# Patient Record
Sex: Male | Born: 1955 | Race: White | Hispanic: No | Smoking: Current every day smoker
Health system: Southern US, Community
[De-identification: ages and names within clinical notes are randomized; demographics above are authoritative.]

---

## 2004-01-26 ENCOUNTER — Other Ambulatory Visit: Payer: Self-pay

## 2017-07-18 ENCOUNTER — Emergency Department
Admission: EM | Admit: 2017-07-18 | Discharge: 2017-07-18 | Disposition: A | Discharge status: Home / Self Care | Payer: Self-pay | Attending: Emergency Medicine | Admitting: Emergency Medicine

## 2017-07-18 ENCOUNTER — Encounter: Payer: Self-pay | Admitting: Emergency Medicine

## 2017-07-18 ENCOUNTER — Emergency Department: Payer: Self-pay

## 2017-07-18 DIAGNOSIS — R42 Dizziness and giddiness: Secondary | ICD-10-CM | POA: Insufficient documentation

## 2017-07-18 DIAGNOSIS — F17200 Nicotine dependence, unspecified, uncomplicated: Secondary | ICD-10-CM | POA: Insufficient documentation

## 2017-07-18 DIAGNOSIS — H532 Diplopia: Secondary | ICD-10-CM | POA: Insufficient documentation

## 2017-07-18 LAB — BASIC METABOLIC PANEL
ANION GAP: 9 (ref 5–15)
BUN: 23 mg/dL — AB (ref 6–20)
CO2: 23 mmol/L (ref 22–32)
CREATININE: 1.01 mg/dL (ref 0.61–1.24)
Calcium: 9.7 mg/dL (ref 8.9–10.3)
Chloride: 108 mmol/L (ref 101–111)
Glucose, Bld: 95 mg/dL (ref 65–99)
Potassium: 4 mmol/L (ref 3.5–5.1)
Sodium: 140 mmol/L (ref 135–145)

## 2017-07-18 LAB — URINALYSIS, COMPLETE (UACMP) WITH MICROSCOPIC
BILIRUBIN URINE: NEGATIVE
Bacteria, UA: NONE SEEN
Glucose, UA: NEGATIVE mg/dL
Hgb urine dipstick: NEGATIVE
Ketones, ur: NEGATIVE mg/dL
LEUKOCYTES UA: NEGATIVE
NITRITE: NEGATIVE
PH: 5 (ref 5.0–8.0)
Protein, ur: NEGATIVE mg/dL
SPECIFIC GRAVITY, URINE: 1.021 (ref 1.005–1.030)
SQUAMOUS EPITHELIAL / LPF: NONE SEEN
WBC, UA: NONE SEEN WBC/hpf (ref 0–5)

## 2017-07-18 LAB — CBC
HCT: 42.7 % (ref 40.0–52.0)
Hemoglobin: 14.8 g/dL (ref 13.0–18.0)
MCH: 31 pg (ref 26.0–34.0)
MCHC: 34.6 g/dL (ref 32.0–36.0)
MCV: 89.6 fL (ref 80.0–100.0)
PLATELETS: 251 10*3/uL (ref 150–440)
RBC: 4.77 MIL/uL (ref 4.40–5.90)
RDW: 13.9 % (ref 11.5–14.5)
WBC: 8.3 10*3/uL (ref 3.8–10.6)

## 2017-07-18 LAB — GLUCOSE, CAPILLARY: Glucose-Capillary: 95 mg/dL (ref 65–99)

## 2017-07-18 LAB — TROPONIN I

## 2017-07-18 MED ORDER — ASPIRIN 81 MG PO CHEW
324.0000 mg | CHEWABLE_TABLET | Freq: Once | ORAL | Status: AC
  Administered 2017-07-18: 324 mg via ORAL
  Filled 2017-07-18: qty 4

## 2017-07-18 MED ORDER — ASPIRIN EC 81 MG PO TBEC: 81.0000 mg | DELAYED_RELEASE_TABLET | Freq: Every day | ORAL | 0 refills | Status: AC

## 2017-07-18 NOTE — ED Triage Notes (Signed)
Pt c/o blurred vision and dizziness on and off for two weeks. Denies any other symptoms at this time.

## 2017-07-18 NOTE — ED Provider Notes (Signed)
[facility] Emergency Department Provider Note  ____________________________________________   First MD Initiated Contact with Patient 07/18/17 1527     (approximate)  I have reviewed the triage vital signs and the nursing notes.   HISTORY  Chief Complaint Dizziness and Blurred Vision    HPI Miguel Thompson is a 61 y.o. male without any chronic medical conditions was presenting to the emergency department with several weeks of intermittent blurred/double vision and lightheadedness. He says that the episodes may last about 20 seconds at a time. He denies any weakness or any other symptoms during the episodes. Denies any ringing or hearing loss. Denies any pain. Says that he has been eating and drinking normally but does not drink a lot of water. 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 Says that his grandmother had a history of stroke. Has been smoking for approximately 40 years. He is asymptomatic at this time. Says that the episodes have not been getting any worse or more frequent and he sometimes goes days without having a single episode. He says that he came today because he had an episode while in the car which concerned him. However, he remains asymptomatic at this time.He describes his blurred vision is double vision that appears more the vertical plane.   History reviewed. No pertinent past medical history.  There are no active problems to display for this patient.   History reviewed. No pertinent surgical history.  Prior to Admission medications   Not on File    Allergies Codeine and Penicillins  No family history on file.  Social History Social History  Substance Use Topics  . Smoking status: Current Every Day Smoker  . Smokeless tobacco: Never Used  . Alcohol use No    Review of Systems  Constitutional: No fever/chills Eyes: as above ENT: No sore throat. Cardiovascular: Denies chest pain. Respiratory:  Denies shortness of breath. Gastrointestinal: No abdominal pain.  No nausea, no vomiting.  No diarrhea.  No constipation. Genitourinary: Negative for dysuria. Musculoskeletal: Negative for back pain. Skin: Negative for rash. Neurological: Negative for headaches, focal weakness or numbness.   ____________________________________________   PHYSICAL EXAM:  VITAL SIGNS: ED Triage Vitals [07/18/17 1349]  Enc Vitals Group     BP (!) 152/79     Pulse Rate 73     Resp 20     Temp 98.1 F (36.7 C)     Temp Source Oral     SpO2 96 %     Weight 205 lb (93 kg)     Height 5\' 10"  (1.778 m)     Head Circumference      Peak Flow      Pain Score      Pain Loc      Pain Edu?      Excl. in GC?     Constitutional: Alert and oriented. Well appearing and in no acute distress. Eyes: Conjunctivae are normal. PERRLA. No nystagmus. Extraocular muscles are intact. Head: Atraumatic. Mild to moderate cerumen buildup bilaterally without impaction. Nose: No congestion/rhinnorhea. Mouth/Throat: Mucous membranes are moist.  Neck: No stridor.   Cardiovascular: Normal rate, regular rhythm. Grossly normal heart sounds.   Respiratory: Normal respiratory effort.  No retractions. Lungs CTAB. Gastrointestinal: Soft and nontender. No distention. No CVA tenderness. Musculoskeletal: No lower extremity tenderness nor edema.  No joint effusions. Neurologic:  Normal speech and language. No gross focal neurologic deficits are appreciated. No ataxia on finger to  nose testing. Skin:  Skin is warm, dry and intact. No rash noted. Psychiatric: Mood and affect are normal. Speech and behavior are normal.  ____________________________________________   LABS (all labs ordered are listed, but only abnormal results are displayed)  Labs Reviewed  BASIC METABOLIC PANEL - Abnormal; Notable for the following:       Result Value   BUN 23 (*)    All other components within normal limits  URINALYSIS, COMPLETE (UACMP) WITH  MICROSCOPIC - Abnormal; Notable for the following:    Color, Urine YELLOW (*)    APPearance CLEAR (*)    All other components within normal limits  CBC  GLUCOSE, CAPILLARY  TROPONIN I  CBG MONITORING, ED   ____________________________________________  EKG  ED ECG REPORT I, Arelia LongestSchaevitz,  Rebekkah Powless M, the attending physician, personally viewed and interpreted this ECG.   Date: 07/18/2017  EKG Time: 1354  Rate: 82  Rhythm: normal sinus rhythm  Axis: Normal  Intervals:none  ST&T Change: No ST segment elevation or depression. No abnormal T-wave inversion.  ____________________________________________  RADIOLOGY  Normal CT of the head. ____________________________________________   PROCEDURES  Procedure(s) performed:   Procedures  Critical Care performed:   ____________________________________________   INITIAL IMPRESSION / ASSESSMENT AND PLAN / ED COURSE  Pertinent labs & imaging results that were available during my care of the patient were reviewed by me and considered in my medical decision making (see chart for details).  ----------------------------------------- 6:04 PM on 07/18/2017 -----------------------------------------  Patient without any further episodes of the blurred vision or dizziness in the emergency department. We discussed possible further imaging with MRI of the brain with the patient says that he would rather do this as an outpatient. Unfortunate, he does not have a primary care doctor but would like to follow-up at [facility]. We discussed possible strokelike symptoms today and that he should start taking a baby aspirin daily. I'll give him a prescription for this although he knows that he may also purchase that over-the-counter. He knows to return to the emergency department for any worsening or concerning symptoms. He is understanding the plan and willing to comply. Multiple weeks of symptoms without any objective findings on the CT scan.       ____________________________________________   FINAL CLINICAL IMPRESSION(S) / ED DIAGNOSES  Lightheadedness. Diplopia    NEW MEDICATIONS STARTED DURING THIS VISIT:  New Prescriptions   No medications on file     Note:  This document was prepared using Dragon voice recognition software and may include unintentional dictation errors.     Myrna BlazerSchaevitz, Chrissy Ealey Matthew, MD 07/18/17 [phone]

## 2017-07-18 NOTE — ED Notes (Signed)
BS 95

## 2017-07-18 NOTE — ED Notes (Signed)
Pt states that he has had dizziness and blurred vision that last about 10 or 15 seconds. Started about 1 or 2 weeks ago. Family at the bedside.

## 2017-08-06 ENCOUNTER — Emergency Department: Payer: Self-pay

## 2017-08-06 ENCOUNTER — Encounter: Payer: Self-pay | Admitting: Emergency Medicine

## 2017-08-06 ENCOUNTER — Emergency Department
Admission: EM | Admit: 2017-08-06 | Discharge: 2017-08-06 | Disposition: A | Discharge status: Home / Self Care | Payer: Self-pay | Attending: Emergency Medicine | Admitting: Emergency Medicine

## 2017-08-06 DIAGNOSIS — Z7982 Long term (current) use of aspirin: Secondary | ICD-10-CM | POA: Insufficient documentation

## 2017-08-06 DIAGNOSIS — L578 Other skin changes due to chronic exposure to nonionizing radiation: Secondary | ICD-10-CM | POA: Insufficient documentation

## 2017-08-06 DIAGNOSIS — X32XXXA Exposure to sunlight, initial encounter: Secondary | ICD-10-CM | POA: Insufficient documentation

## 2017-08-06 DIAGNOSIS — L989 Disorder of the skin and subcutaneous tissue, unspecified: Secondary | ICD-10-CM | POA: Insufficient documentation

## 2017-08-06 DIAGNOSIS — R0789 Other chest pain: Secondary | ICD-10-CM | POA: Insufficient documentation

## 2017-08-06 DIAGNOSIS — F17200 Nicotine dependence, unspecified, uncomplicated: Secondary | ICD-10-CM | POA: Insufficient documentation

## 2017-08-06 LAB — CBC
HCT: 42.5 % (ref 40.0–52.0)
HEMOGLOBIN: 14.9 g/dL (ref 13.0–18.0)
MCH: 31.4 pg (ref 26.0–34.0)
MCHC: 35.1 g/dL (ref 32.0–36.0)
MCV: 89.6 fL (ref 80.0–100.0)
Platelets: 258 10*3/uL (ref 150–440)
RBC: 4.75 MIL/uL (ref 4.40–5.90)
RDW: 13.2 % (ref 11.5–14.5)
WBC: 7.7 10*3/uL (ref 3.8–10.6)

## 2017-08-06 LAB — BASIC METABOLIC PANEL
Anion gap: 9 (ref 5–15)
BUN: 17 mg/dL (ref 6–20)
CALCIUM: 9.7 mg/dL (ref 8.9–10.3)
CO2: 24 mmol/L (ref 22–32)
CREATININE: 1.12 mg/dL (ref 0.61–1.24)
Chloride: 106 mmol/L (ref 101–111)
GFR calc Af Amer: >60 mL/min (ref >60)
GFR calc non Af Amer: >60 mL/min (ref >60)
GLUCOSE: 107 mg/dL — AB (ref 65–99)
Potassium: 4.1 mmol/L (ref 3.5–5.1)
Sodium: 139 mmol/L (ref 135–145)

## 2017-08-06 LAB — HEPATIC FUNCTION PANEL
ALK PHOS: 68 U/L (ref 38–126)
ALT: 19 U/L (ref 17–63)
AST: 22 U/L (ref 15–41)
Albumin: 4.3 g/dL (ref 3.5–5.0)
BILIRUBIN DIRECT: 0.1 mg/dL (ref 0.1–0.5)
BILIRUBIN TOTAL: 1.1 mg/dL (ref 0.3–1.2)
Indirect Bilirubin: 1 mg/dL — ABNORMAL HIGH (ref 0.3–0.9)
Total Protein: 7.1 g/dL (ref 6.5–8.1)

## 2017-08-06 LAB — TROPONIN I: Troponin I: <0.03 ng/mL (ref <0.03)

## 2017-08-06 LAB — LIPASE, BLOOD: Lipase: 24 U/L (ref 11–51)

## 2017-08-06 NOTE — Discharge Instructions (Addendum)
return to the emergency room for any new or worrisome symptoms including increased pain, worsening pain, change in pain, shortness of breath, nausea, inability to tolerate exercise or walking for any reason, or any other concerning symptoms including numbness or weakness. Follow closely with cardiologist tomorrow for further evaluation.  you have multiple skin lesions on her body sort which are concerning to me please follow closely with dermatology in the next few days. Dr. Lennette Bihari will see when his office tomorrow morning at 9 AM for your chest, there is also a primary care doctor there who may be able to help you with her other symptoms of lightheadedness and headaches.

## 2017-08-06 NOTE — ED Provider Notes (Addendum)
[facility] Emergency Department Provider Note  ____________________________________________   I have reviewed the triage vital signs and the nursing notes.   HISTORY  Chief Complaint Chest Pain    HPI Miguel Thompson is a 61 y.o. male who is healthy aside from tobacco abuse history presents today complaining of occasional sharp chest pain. Happens in the sternal region. On the right. he can purse I see determine exactly where it is. It hurts when he touches it or changes position sometimes. Aside from that nothing makes it worse. He gets "twinges" of pain there that lasts for about a minute. Sharp, nonradiating. Not exertional. Non-positional otherwise that assisting orthopnea. Shortness of breath or leg swelling. He has no personal or family history of PE or DVT. His absolute no exertional symptoms. He did not feel short of breath. He states that he also has occasional headaches and blurred vision which is been going on for several months as well. He is not suffering from that today and hasn't last few days. He was seen and evaluated for that as well recently.he denies any leg swelling, the pain is not present now. He is not sure if he had any this morning. He states it never lasts for more than a minute.    History reviewed. No pertinent past medical history.  There are no active problems to display for this patient.   History reviewed. No pertinent surgical history.  Prior to Admission medications   Medication Sig Start Date End Date Taking? Authorizing Provider  aspirin EC 81 MG tablet Take 1 tablet (81 mg total) by mouth daily. 07/18/17 07/18/18 Yes Schaevitz, Myra Rude, MD    Allergies Codeine and Penicillins  History reviewed. No pertinent family history.  Social History Social History  Substance Use Topics  . Smoking status: Current Every Day Smoker  . Smokeless tobacco: Never Used  . Alcohol use No    Review of Systems Constitutional: No  fever/chills Eyes: No visual changes. ENT: No sore throat. No stiff neck no neck pain Cardiovascular: refer to history of present illnessregarding chest pain Respiratory: Denies shortness of breath. Gastrointestinal:   no vomiting.  No diarrhea.  No constipation. Genitourinary: Negative for dysuria. Musculoskeletal: Negative lower extremity swelling Skin: Negative for rash. Neurological: Negative for severe headaches, focal weakness or numbness.   ____________________________________________   PHYSICAL EXAM:  VITAL SIGNS: ED Triage Vitals  Enc Vitals Group     BP 08/06/17 1006 (!) 145/87     Pulse Rate 08/06/17 1006 77     Resp 08/06/17 1006 18     Temp 08/06/17 1006 98.8 F (37.1 C)     Temp Source 08/06/17 1006 Oral     SpO2 08/06/17 1006 97 %     Weight --      Height --      Head Circumference --      Peak Flow --      Pain Score 08/06/17 1011 5     Pain Loc --      Pain Edu? --      Excl. in GC? --     Constitutional: Alert and oriented. Well appearing and in no acute distress. Eyes: Conjunctivae are normal Head: Atraumatic HEENT: No congestion/rhinnorhea. Mucous membranes are moist.  Oropharynx non-erythematous Neck:   Nontender with no meningismus, no masses, no stridor Cardiovascular: Normal rate, regular rhythm. Grossly normal heart sounds.  Good peripheral circulation. chest: Tender to palpation to the right chest wall there is no lesions  no crepitus no flail chest with touch this area, at the costochondral margin on the right, patient states "ouch that's the pain right there" and was back. There is no lesions noted herpetic or otherwise. Respiratory: Normal respiratory effort.  No retractions. Lungs CTAB. Abdominal: Soft and nontender. No distention. No guarding no rebound Back:  There is no focal tenderness or step off.  there is no midline tenderness there are no lesions noted. there is no CVA tenderness Musculoskeletal: No lower extremity tenderness, no  upper extremity tenderness. No joint effusions, no DVT signs strong distal pulses no edema Neurologic:  Normal speech and language. No gross focal neurologic deficits are appreciated.  Skin:  Skin is warm, dry and intact. No rash noted. Psychiatric: Mood and affect are normal. Speech and behavior are normal.  ____________________________________________   LABS (all labs ordered are listed, but only abnormal results are displayed)  Labs Reviewed  BASIC METABOLIC PANEL - Abnormal; Notable for the following:       Result Value   Glucose, Bld 107 (*)    All other components within normal limits  CBC  TROPONIN I  LIPASE, BLOOD  HEPATIC FUNCTION PANEL    Pertinent labs  results that were available during my care of the patient were reviewed by me and considered in my medical decision making (see chart for details). ____________________________________________  EKG  I personally interpreted any EKGs ordered by me or triage normal sinus rhythm rate 80 bpm no acute ST elevation or acute ST depression normal axis unremarkable EKG ____________________________________________  RADIOLOGY  Pertinent labs & imaging results that were available during my care of the patient were reviewed by me and considered in my medical decision making (see chart for details). If possible, patient and/or family made aware of any abnormal findings. ____________________________________________    PROCEDURES  Procedure(s) performed: None  Procedures  Critical Care performed: None  ____________________________________________   INITIAL IMPRESSION / ASSESSMENT AND PLAN / ED COURSE  Pertinent labs & imaging results that were available during my care of the patient were reviewed by me and considered in my medical decision making (see chart for details).  patient here with very reproducible chest wall pain off and on for the last month. Very atypical story. Certainly not exertional. He does have some  risk factors for this reason we did do an EKG which is reassuring and troponin which is reassuring. I will send liver function tests. Low as abdomen is benign. Differential diagnosis is broad and includesmyocarditisendocarditis pericarditis, also includes ACS PE or dissection, includes pericardial effusion, pneumonia, pneumothorax, oncologic process etc. However,he has a very reassuring exam and his symptoms even going on for some time and very atypical pain very unusual for her to be any of the above-named entities, chest x-ray is reassuring blood work is reassuring vital signs are reassuring EKG is reassuring chronicity of complaints is reassuring description of complaints is reassuring etc.in addition, patient does have a complaint of occasional blurry vision, this certainly no evidence of the patient is having any kind neurologic event today. He is neurologically intact. He has already had a CT scan for this. We will defer further work up on that for outpatient care. Finally, I do notice the patient has some lesions on his leg that could be precancerous or cancerous, there is on his leg 1 cm raised lesion, mildly dark which is raised. I've a strongly advised him to follow up with dermatology closely as an outpatient we will give him referral.he has  no risk factors for PE, and I don't suspect that entity to be present, and addition, his smoking sensation was advised and we discussed that for 4 minutes    ____________________________________________   FINAL CLINICAL IMPRESSION(S) / ED DIAGNOSES  Final diagnoses:  None      This chart was dictated using voice recognition software.  Despite best efforts to proofread,  errors can occur which can change meaning.      Jeanmarie Plant, MD 08/06/17 1106    Jeanmarie Plant, MD 08/06/17 [phone]

## 2017-08-06 NOTE — ED Triage Notes (Signed)
Pt c/o left sided intermittent chest pain for 1-2 weeks with SHOB.  Was seen here for same.  Also c/o intermittent headaches with blurry vision at times as well.  Skin warm and dry. Respirations unlabored.

## 2018-09-11 IMAGING — CT CT HEAD W/O CM
3 series · 14 of 47 positions shown, 16 images · non-contrast
Comparison: None.

CLINICAL DATA: Intermittent blurred vision and dizziness for 2
weeks. No known injury.

EXAM:
CT HEAD WITHOUT CONTRAST
TECHNIQUE: Contiguous axial images were obtained from the base of the skull
through the vertex without intravenous contrast.

[Series 2: head wo · axial · 0.47mm/px · z∈[-139,-9]mm · 8 of 32 slices shown, 10 images]
[im 3/32  brain]
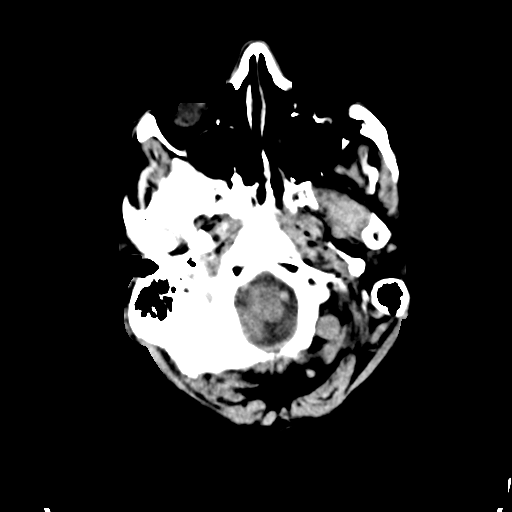
[im 3/32  bone]
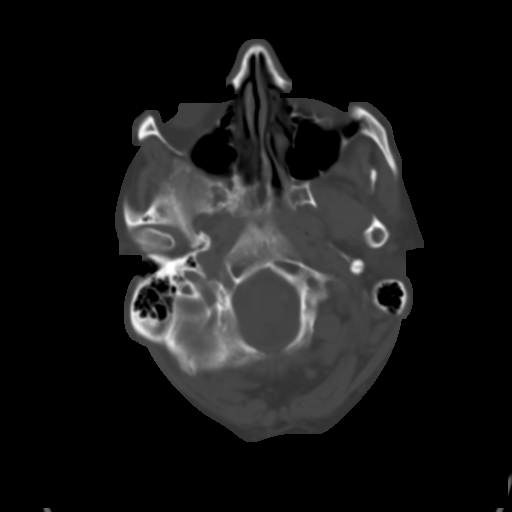
[im 7/32  brain]
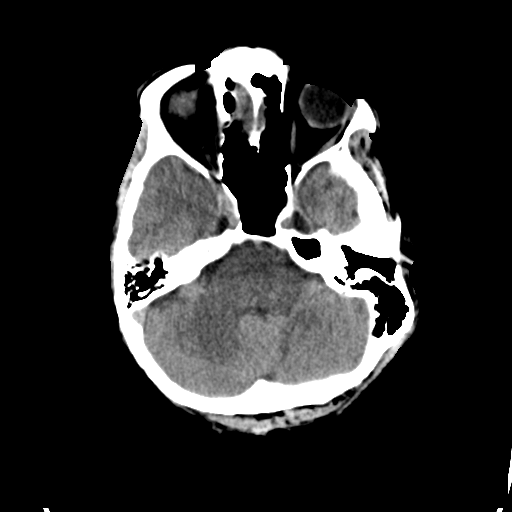
[im 10/32  brain]
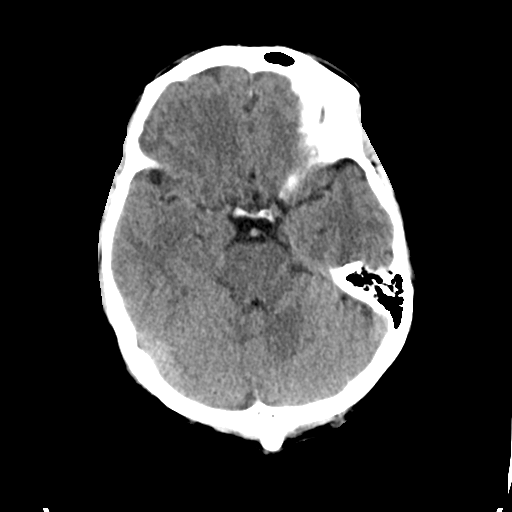
[im 14/32  brain]
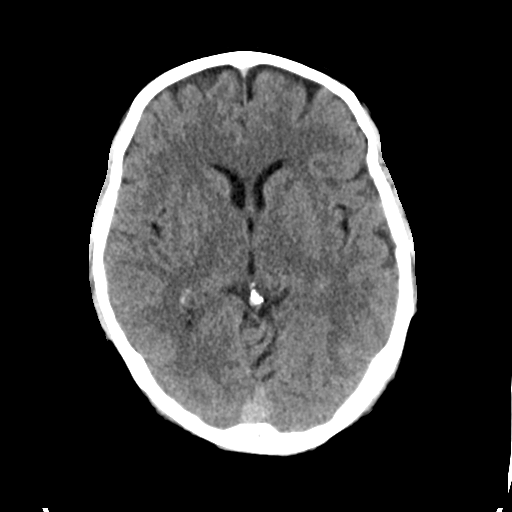
[im 18/32  brain]
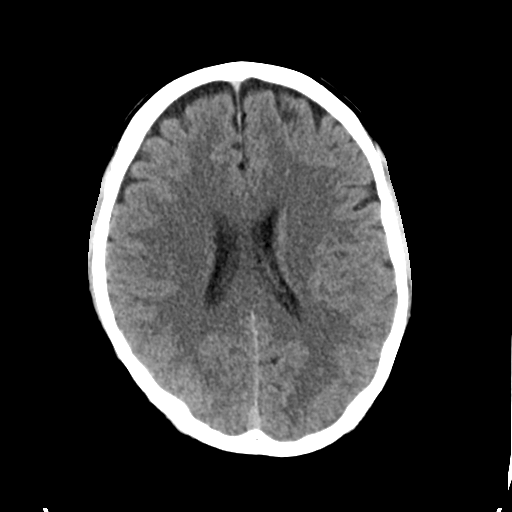
[im 18/32  bone]
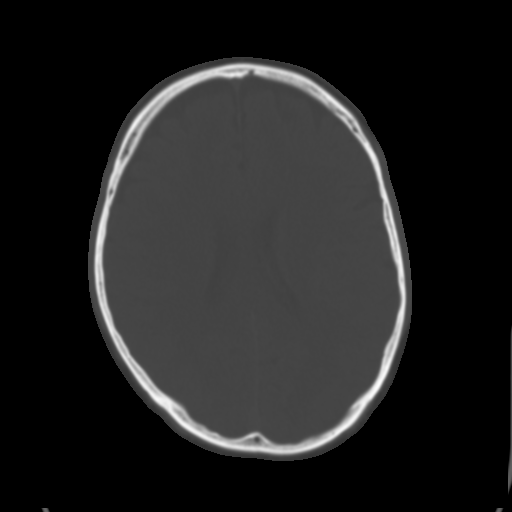
[im 22/32  brain]
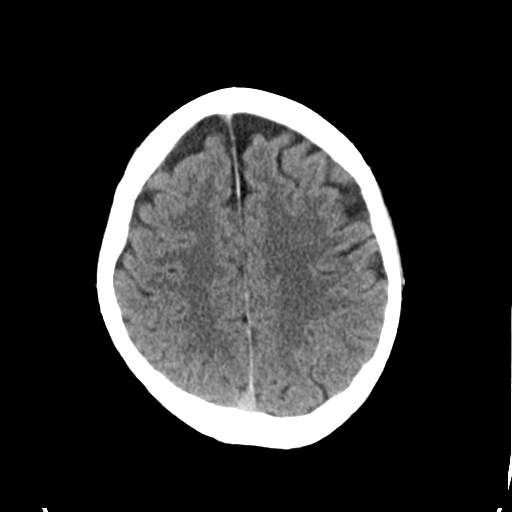
[im 25/32  brain]
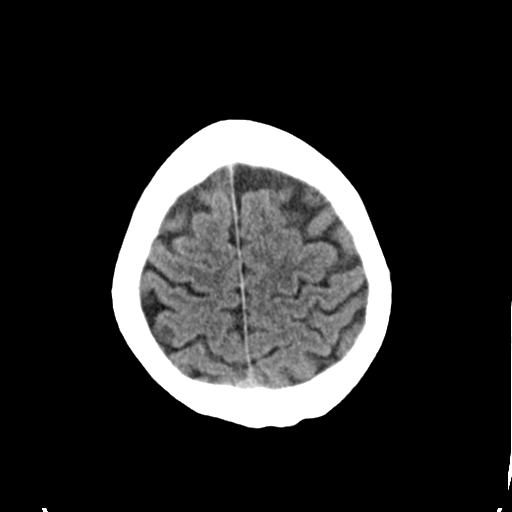
[im 29/32  brain]
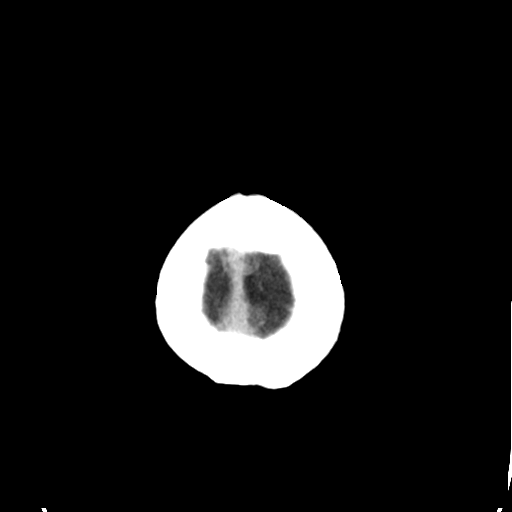

[Series 4: coronal soft tissue · coronal · 0.31mm/px · 3 of 62 slices shown]
[im 21/62  brain]
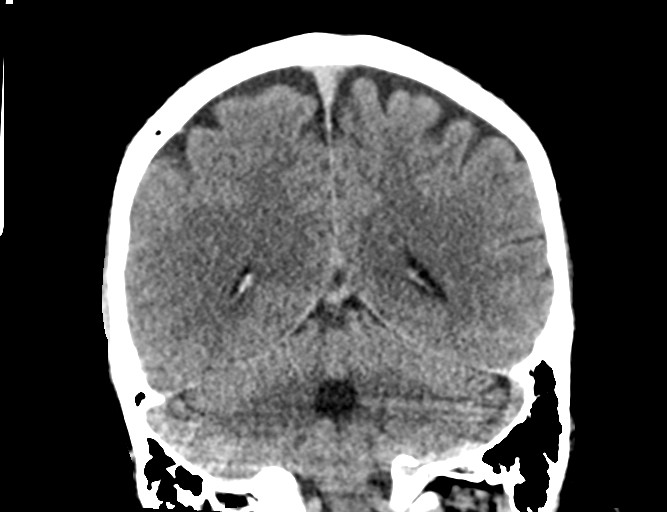
[im 28/62  brain]
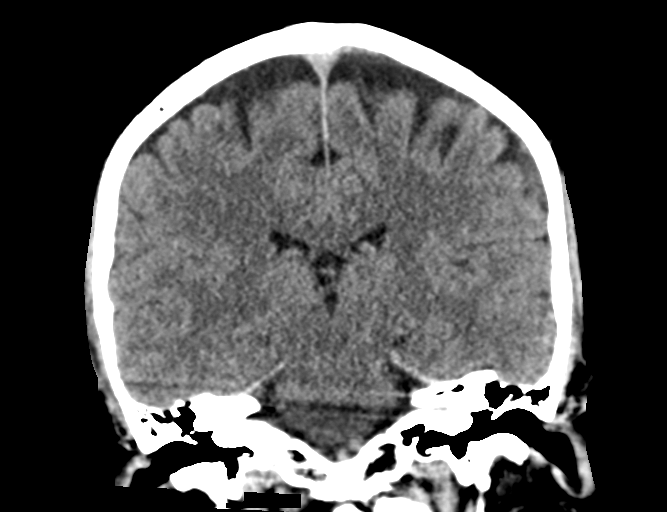
[im 34/62  brain]
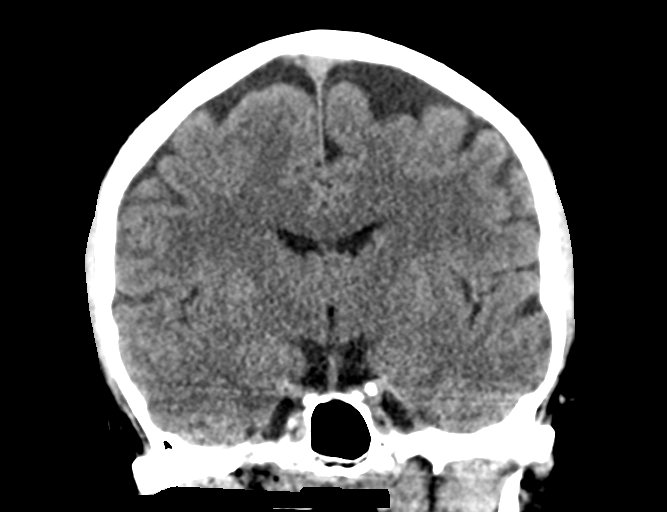

[Series 5: sagittal soft tissue · sagittal · 0.31mm/px · 3 of 53 slices shown]
[im 18/53  brain]
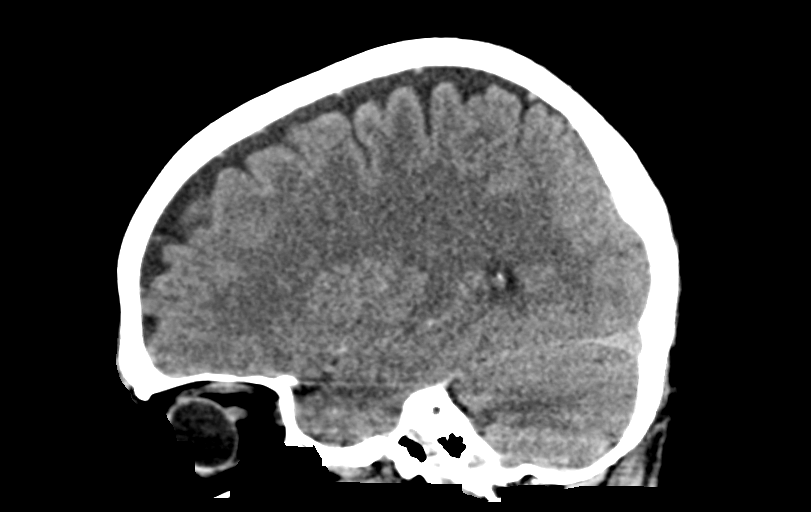
[im 27/53  brain]
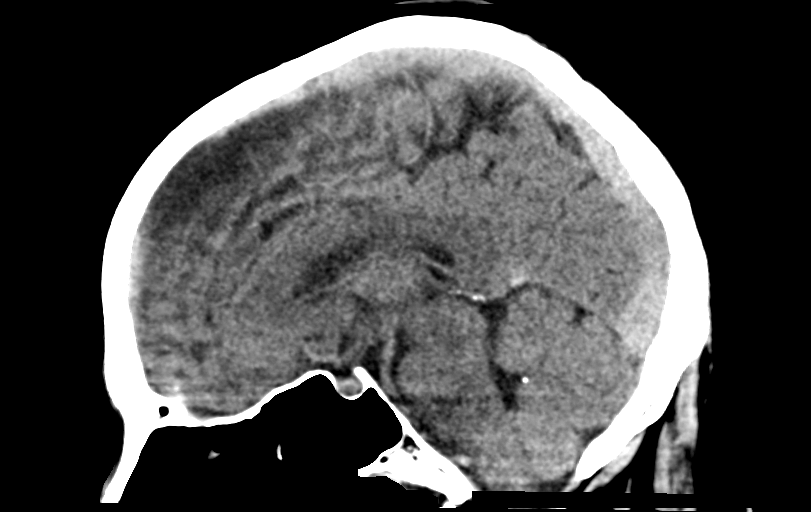
[im 35/53  brain]
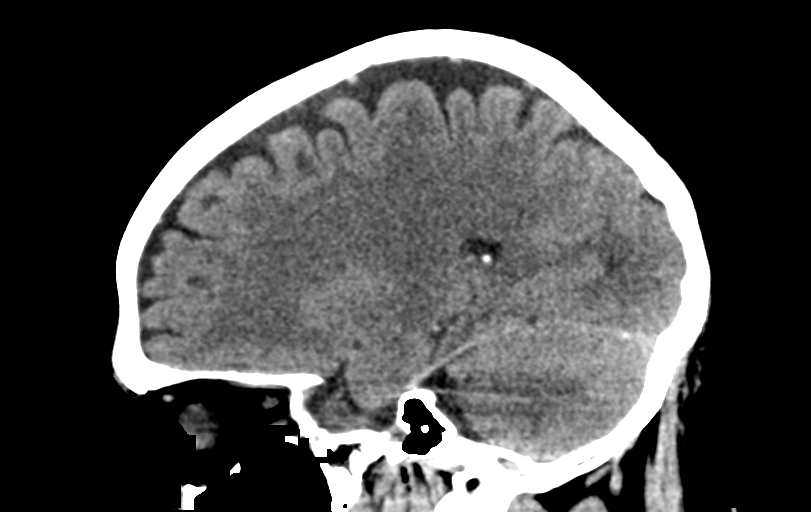

[14 of 47 positions shown; findings below may reference images not displayed]

FINDINGS: Brain: There is no evidence of acute intracranial abnormality
including hemorrhage, infarct, mass lesion, mass effect, midline
shift or abnormal extra-axial fluid collection. No hydrocephalus or
pneumocephalus.

Vascular: Negative.

Skull: Intact.

Sinuses/Orbits: Negative.

Other: None.
IMPRESSION: Negative head CT.

## 2020-09-14 DIAGNOSIS — I10 Essential (primary) hypertension: Secondary | ICD-10-CM | POA: Diagnosis not present

## 2020-09-14 DIAGNOSIS — E785 Hyperlipidemia, unspecified: Secondary | ICD-10-CM | POA: Diagnosis not present

## 2020-09-14 DIAGNOSIS — R7301 Impaired fasting glucose: Secondary | ICD-10-CM | POA: Diagnosis not present

## 2020-09-16 DIAGNOSIS — R42 Dizziness and giddiness: Secondary | ICD-10-CM | POA: Diagnosis not present

## 2020-09-16 DIAGNOSIS — I10 Essential (primary) hypertension: Secondary | ICD-10-CM | POA: Diagnosis not present

## 2020-09-16 DIAGNOSIS — R010 Benign and innocent cardiac murmurs: Secondary | ICD-10-CM | POA: Diagnosis not present

## 2020-09-16 DIAGNOSIS — E785 Hyperlipidemia, unspecified: Secondary | ICD-10-CM | POA: Diagnosis not present

## 2020-09-23 DIAGNOSIS — H81399 Other peripheral vertigo, unspecified ear: Secondary | ICD-10-CM | POA: Diagnosis not present

## 2020-09-23 DIAGNOSIS — R010 Benign and innocent cardiac murmurs: Secondary | ICD-10-CM | POA: Diagnosis not present

## 2020-10-01 DIAGNOSIS — R7303 Prediabetes: Secondary | ICD-10-CM | POA: Diagnosis not present

## 2020-10-01 DIAGNOSIS — F17209 Nicotine dependence, unspecified, with unspecified nicotine-induced disorders: Secondary | ICD-10-CM | POA: Diagnosis not present

## 2020-10-01 DIAGNOSIS — E785 Hyperlipidemia, unspecified: Secondary | ICD-10-CM | POA: Diagnosis not present

## 2020-10-01 DIAGNOSIS — I10 Essential (primary) hypertension: Secondary | ICD-10-CM | POA: Diagnosis not present

## 2020-10-14 DIAGNOSIS — R7303 Prediabetes: Secondary | ICD-10-CM | POA: Diagnosis not present

## 2020-10-14 DIAGNOSIS — F17210 Nicotine dependence, cigarettes, uncomplicated: Secondary | ICD-10-CM | POA: Diagnosis not present

## 2020-10-14 DIAGNOSIS — I340 Nonrheumatic mitral (valve) insufficiency: Secondary | ICD-10-CM | POA: Diagnosis not present

## 2020-10-14 DIAGNOSIS — E785 Hyperlipidemia, unspecified: Secondary | ICD-10-CM | POA: Diagnosis not present

## 2020-10-14 DIAGNOSIS — I10 Essential (primary) hypertension: Secondary | ICD-10-CM | POA: Diagnosis not present

## 2020-10-19 ENCOUNTER — Other Ambulatory Visit: Payer: Self-pay

## 2020-10-19 ENCOUNTER — Ambulatory Visit (INDEPENDENT_AMBULATORY_CARE_PROVIDER_SITE_OTHER): Payer: Medicare Other | Admitting: Vascular Surgery

## 2020-10-19 DIAGNOSIS — E782 Mixed hyperlipidemia: Secondary | ICD-10-CM | POA: Diagnosis not present

## 2020-10-19 DIAGNOSIS — I6523 Occlusion and stenosis of bilateral carotid arteries: Secondary | ICD-10-CM | POA: Diagnosis not present

## 2020-10-19 DIAGNOSIS — I10 Essential (primary) hypertension: Secondary | ICD-10-CM | POA: Diagnosis not present

## 2020-10-19 NOTE — Progress Notes (Signed)
MRN : [id number]  Miguel Thompson is a 64 y.o. (02-Sep-1956) male who presents with chief complaint of  Chief Complaint  Patient presents with  . New Patient (Initial Visit)    [place], carotid stenosis  .  History of Present Illness:   The patient is seen for evaluation of carotid stenosis. The carotid stenosis was identified after the patient requested an ultrasound of his carotid arteries.  He has a very strong family history of stroke secondary to carotid disease.  Duplex ultrasound from an outside facility showed RICA <50% and LICA <50% (with >50% LECA)  The patient denies amaurosis fugax. There is no recent history of TIA symptoms or focal motor deficits. There is no prior documented CVA.  There is no history of migraine headaches. There is no history of seizures.  The patient is taking enteric-coated aspirin 81 mg daily.  The patient has a history of coronary artery disease, no recent episodes of angina or shortness of breath. The patient denies PAD or claudication symptoms. There is a history of hyperlipidemia which is being treated with a statin.    Current Meds  Medication Sig  . Cholecalciferol (VITAMIN D3) 75 MCG (3000 UT) TABS Take 50 mg by mouth.    No past medical history on file.  No past surgical history on file.  Social History Social History   Tobacco Use  . Smoking status: Current Every Day Smoker  . Smokeless tobacco: Never Used  Substance Use Topics  . Alcohol use: No  . Drug use: No    Family History No family history of bleeding/clotting disorders, porphyria or autoimmune disease   Allergies  Allergen Reactions  . Codeine   . Penicillins     Has patient had a PCN reaction causing immediate rash, facial/tongue/throat swelling, SOB or lightheadedness with hypotension: Unknown Has patient had a PCN reaction causing severe rash involving mucus membranes or skin necrosis: Unknown Has patient had a PCN reaction that required  hospitalization: Unknown Has patient had a PCN reaction occurring within the last 10 years: Unknown If all of the above answers are "NO", then may proceed with Cephalosporin use.      REVIEW OF SYSTEMS (Negative unless checked)  Constitutional: [] Weight loss  [] Fever  [] Chills Cardiac: [] Chest pain   [] Chest pressure   [] Palpitations   [] Shortness of breath when laying flat   [] Shortness of breath with exertion. Vascular:  [] Pain in legs with walking   [] Pain in legs at rest  [] History of DVT   [] Phlebitis   [] Swelling in legs   [] Varicose veins   [] Non-healing ulcers Pulmonary:   [] Uses home oxygen   [] Productive cough   [] Hemoptysis   [] Wheeze  [] COPD   [] Asthma Neurologic:  [] Dizziness   [] Seizures   [] History of stroke   [] History of TIA  [] Aphasia   [] Vissual changes   [] Weakness or numbness in arm   [] Weakness or numbness in leg Musculoskeletal:   [] Joint swelling   [] Joint pain   [] Low back pain Hematologic:  [] Easy bruising  [] Easy bleeding   [] Hypercoagulable state   [] Anemic Gastrointestinal:  [] Diarrhea   [] Vomiting  [] Gastroesophageal reflux/heartburn   [] Difficulty swallowing. Genitourinary:  [] Chronic kidney disease   [] Difficult urination  [] Frequent urination   [] Blood in urine Skin:  [] Rashes   [] Ulcers  Psychological:  [] History of anxiety   []  History of major depression.  Physical Examination  Vitals:   10/19/20 1307 10/19/20 1308  BP: 114/76 116/81  Pulse: 93  Weight: 229 lb (103.9 kg)   Height: 5\' 10"  (1.778 m)    Body mass index is 32.86 kg/m. Gen: WD/WN, NAD Head: [place]/AT, No temporalis wasting.  Ear/Nose/Throat: Hearing grossly intact, nares w/o erythema or drainage, poor dentition Eyes: PER, EOMI, sclera nonicteric.  Neck: Supple, no masses.  No bruit or JVD.  Pulmonary:  Good air movement, clear to auscultation bilaterally, no use of accessory muscles.  Cardiac: RRR, normal S1, S2, no Murmurs. Vascular: Bilateral carotid bruits Vessel Right Left    Radial Palpable Palpable  Carotid Palpable Palpable  Gastrointestinal: soft, non-distended. No guarding/no peritoneal signs.  Musculoskeletal: M/S 5/5 throughout.  No deformity or atrophy.  Neurologic: CN 2-12 intact. Pain and light touch intact in extremities.  Symmetrical.  Speech is fluent. Motor exam as listed above. Psychiatric: Judgment intact, Mood & affect appropriate for pt's clinical situation. Dermatologic: No rashes or ulcers noted.  No changes consistent with cellulitis.   CBC Lab Results  Component Value Date   WBC 7.7 08/06/2017   HGB 14.9 08/06/2017   HCT 42.5 08/06/2017   MCV 89.6 08/06/2017   PLT 258 08/06/2017    BMET    Component Value Date/Time   NA 139 08/06/2017 1009   K 4.1 08/06/2017 1009   CL 106 08/06/2017 1009   CO2 24 08/06/2017 1009   GLUCOSE 107 (H) 08/06/2017 1009   BUN 17 08/06/2017 1009   CREATININE 1.12 08/06/2017 1009   CALCIUM 9.7 08/06/2017 1009   GFRNONAA >60 08/06/2017 1009   GFRAA >60 08/06/2017 1009   CrCl cannot be calculated (Patient's most recent lab result is older than the maximum 21 days allowed.).  COAG No results found for: INR, PROTIME  Radiology No results found.   Assessment/Plan 1. Bilateral carotid artery stenosis Recommend:  Given the patient's asymptomatic subcritical stenosis no further invasive testing or surgery at this time.  Duplex ultrasound from an outside facility showed RICA <50% and LICA <50% (with >50% LECA)  Continue antiplatelet therapy as prescribed Continue management of CAD, HTN and Hyperlipidemia Healthy heart diet,  encouraged exercise at least 4 times per week Follow up in 6 months with duplex ultrasound and physical exam   2. Essential hypertension Continue antihypertensive medications as already ordered, these medications have been reviewed and there are no changes at this time.   3. Mixed hyperlipidemia Continue statin as ordered and reviewed, no changes at this  time     08/08/2017, MD  10/19/2020 1:17 PM

## 2020-10-20 ENCOUNTER — Encounter (INDEPENDENT_AMBULATORY_CARE_PROVIDER_SITE_OTHER): Payer: Self-pay | Admitting: Vascular Surgery

## 2020-10-20 ENCOUNTER — Other Ambulatory Visit: Payer: Self-pay | Admitting: Vascular Surgery

## 2020-10-20 ENCOUNTER — Ambulatory Visit
Admission: RE | Admit: 2020-10-20 | Discharge: 2020-10-20 | Disposition: A | Discharge status: Home / Self Care | Payer: Self-pay | Source: Ambulatory Visit | Attending: Vascular Surgery | Admitting: Vascular Surgery

## 2020-10-20 DIAGNOSIS — E785 Hyperlipidemia, unspecified: Secondary | ICD-10-CM | POA: Insufficient documentation

## 2020-10-20 DIAGNOSIS — Z0189 Encounter for other specified special examinations: Secondary | ICD-10-CM

## 2020-10-20 DIAGNOSIS — I10 Essential (primary) hypertension: Secondary | ICD-10-CM | POA: Insufficient documentation

## 2020-10-20 DIAGNOSIS — I6529 Occlusion and stenosis of unspecified carotid artery: Secondary | ICD-10-CM | POA: Insufficient documentation

## 2020-11-04 ENCOUNTER — Encounter (INDEPENDENT_AMBULATORY_CARE_PROVIDER_SITE_OTHER): Payer: Self-pay

## 2020-11-27 ENCOUNTER — Other Ambulatory Visit: Payer: Self-pay

## 2021-01-04 DIAGNOSIS — R5383 Other fatigue: Secondary | ICD-10-CM | POA: Diagnosis not present

## 2021-01-04 DIAGNOSIS — I10 Essential (primary) hypertension: Secondary | ICD-10-CM | POA: Diagnosis not present

## 2021-01-04 DIAGNOSIS — R7303 Prediabetes: Secondary | ICD-10-CM | POA: Diagnosis not present

## 2021-01-04 DIAGNOSIS — E785 Hyperlipidemia, unspecified: Secondary | ICD-10-CM | POA: Diagnosis not present

## 2021-01-07 DIAGNOSIS — E785 Hyperlipidemia, unspecified: Secondary | ICD-10-CM | POA: Diagnosis not present

## 2021-01-07 DIAGNOSIS — I10 Essential (primary) hypertension: Secondary | ICD-10-CM | POA: Diagnosis not present

## 2021-01-07 DIAGNOSIS — F17209 Nicotine dependence, unspecified, with unspecified nicotine-induced disorders: Secondary | ICD-10-CM | POA: Diagnosis not present

## 2021-01-07 DIAGNOSIS — R7303 Prediabetes: Secondary | ICD-10-CM | POA: Diagnosis not present

## 2021-03-11 ENCOUNTER — Telehealth (INDEPENDENT_AMBULATORY_CARE_PROVIDER_SITE_OTHER): Payer: Self-pay | Admitting: Vascular Surgery

## 2021-03-11 NOTE — Telephone Encounter (Signed)
Per Selena Batten, wanted to check to see if patient is willing to change appointment to 9:30am instead of 10:30am, as some techs will be out of office that day.

## 2021-04-19 ENCOUNTER — Encounter (INDEPENDENT_AMBULATORY_CARE_PROVIDER_SITE_OTHER): Payer: Medicare Other

## 2021-04-19 ENCOUNTER — Ambulatory Visit (INDEPENDENT_AMBULATORY_CARE_PROVIDER_SITE_OTHER): Payer: Medicare Other | Admitting: Vascular Surgery

## 2021-04-20 ENCOUNTER — Encounter (INDEPENDENT_AMBULATORY_CARE_PROVIDER_SITE_OTHER): Payer: Self-pay | Admitting: Vascular Surgery

## 2021-05-25 DIAGNOSIS — R5383 Other fatigue: Secondary | ICD-10-CM | POA: Diagnosis not present

## 2021-05-25 DIAGNOSIS — E785 Hyperlipidemia, unspecified: Secondary | ICD-10-CM | POA: Diagnosis not present

## 2021-05-25 DIAGNOSIS — I10 Essential (primary) hypertension: Secondary | ICD-10-CM | POA: Diagnosis not present

## 2021-05-25 DIAGNOSIS — R7303 Prediabetes: Secondary | ICD-10-CM | POA: Diagnosis not present

## 2021-05-28 DIAGNOSIS — F17209 Nicotine dependence, unspecified, with unspecified nicotine-induced disorders: Secondary | ICD-10-CM | POA: Diagnosis not present

## 2021-05-28 DIAGNOSIS — R0602 Shortness of breath: Secondary | ICD-10-CM | POA: Diagnosis not present

## 2021-05-28 DIAGNOSIS — E785 Hyperlipidemia, unspecified: Secondary | ICD-10-CM | POA: Diagnosis not present

## 2021-05-28 DIAGNOSIS — I10 Essential (primary) hypertension: Secondary | ICD-10-CM | POA: Diagnosis not present

## 2021-05-28 DIAGNOSIS — R7303 Prediabetes: Secondary | ICD-10-CM | POA: Diagnosis not present

## 2021-05-28 DIAGNOSIS — I2510 Atherosclerotic heart disease of native coronary artery without angina pectoris: Secondary | ICD-10-CM | POA: Diagnosis not present

## 2021-05-28 DIAGNOSIS — R339 Retention of urine, unspecified: Secondary | ICD-10-CM | POA: Diagnosis not present

## 2021-07-05 DIAGNOSIS — R0602 Shortness of breath: Secondary | ICD-10-CM | POA: Diagnosis not present

## 2022-07-06 DIAGNOSIS — E785 Hyperlipidemia, unspecified: Secondary | ICD-10-CM | POA: Diagnosis not present

## 2022-07-06 DIAGNOSIS — R339 Retention of urine, unspecified: Secondary | ICD-10-CM | POA: Diagnosis not present

## 2022-07-06 DIAGNOSIS — R7303 Prediabetes: Secondary | ICD-10-CM | POA: Diagnosis not present

## 2022-07-06 DIAGNOSIS — I10 Essential (primary) hypertension: Secondary | ICD-10-CM | POA: Diagnosis not present

## 2022-07-08 DIAGNOSIS — I10 Essential (primary) hypertension: Secondary | ICD-10-CM | POA: Diagnosis not present

## 2022-07-08 DIAGNOSIS — E785 Hyperlipidemia, unspecified: Secondary | ICD-10-CM | POA: Diagnosis not present

## 2022-07-08 DIAGNOSIS — Z0001 Encounter for general adult medical examination with abnormal findings: Secondary | ICD-10-CM | POA: Diagnosis not present

## 2022-07-08 DIAGNOSIS — F17209 Nicotine dependence, unspecified, with unspecified nicotine-induced disorders: Secondary | ICD-10-CM | POA: Diagnosis not present

## 2022-07-12 DIAGNOSIS — H2513 Age-related nuclear cataract, bilateral: Secondary | ICD-10-CM | POA: Diagnosis not present

## 2022-07-12 DIAGNOSIS — I10 Essential (primary) hypertension: Secondary | ICD-10-CM | POA: Diagnosis not present

## 2022-07-12 DIAGNOSIS — H43393 Other vitreous opacities, bilateral: Secondary | ICD-10-CM | POA: Diagnosis not present

## 2022-07-12 DIAGNOSIS — R0602 Shortness of breath: Secondary | ICD-10-CM | POA: Diagnosis not present

## 2022-07-12 DIAGNOSIS — H04123 Dry eye syndrome of bilateral lacrimal glands: Secondary | ICD-10-CM | POA: Diagnosis not present

## 2022-07-12 DIAGNOSIS — I351 Nonrheumatic aortic (valve) insufficiency: Secondary | ICD-10-CM | POA: Diagnosis not present

## 2022-07-12 DIAGNOSIS — R55 Syncope and collapse: Secondary | ICD-10-CM | POA: Diagnosis not present

## 2022-07-12 DIAGNOSIS — F17210 Nicotine dependence, cigarettes, uncomplicated: Secondary | ICD-10-CM | POA: Diagnosis not present

## 2022-07-12 DIAGNOSIS — I2510 Atherosclerotic heart disease of native coronary artery without angina pectoris: Secondary | ICD-10-CM | POA: Diagnosis not present

## 2022-07-12 DIAGNOSIS — H532 Diplopia: Secondary | ICD-10-CM | POA: Diagnosis not present

## 2022-07-13 DIAGNOSIS — I422 Other hypertrophic cardiomyopathy: Secondary | ICD-10-CM | POA: Diagnosis not present

## 2022-07-13 DIAGNOSIS — I340 Nonrheumatic mitral (valve) insufficiency: Secondary | ICD-10-CM | POA: Diagnosis not present

## 2022-07-13 DIAGNOSIS — R55 Syncope and collapse: Secondary | ICD-10-CM | POA: Diagnosis not present

## 2022-07-15 DIAGNOSIS — R55 Syncope and collapse: Secondary | ICD-10-CM | POA: Diagnosis not present

## 2022-07-15 DIAGNOSIS — I340 Nonrheumatic mitral (valve) insufficiency: Secondary | ICD-10-CM | POA: Diagnosis not present

## 2022-07-15 DIAGNOSIS — I422 Other hypertrophic cardiomyopathy: Secondary | ICD-10-CM | POA: Diagnosis not present

## 2022-07-19 DIAGNOSIS — I2510 Atherosclerotic heart disease of native coronary artery without angina pectoris: Secondary | ICD-10-CM | POA: Diagnosis not present

## 2022-07-19 DIAGNOSIS — I351 Nonrheumatic aortic (valve) insufficiency: Secondary | ICD-10-CM | POA: Diagnosis not present

## 2022-07-19 DIAGNOSIS — R0602 Shortness of breath: Secondary | ICD-10-CM | POA: Diagnosis not present

## 2022-07-19 DIAGNOSIS — I10 Essential (primary) hypertension: Secondary | ICD-10-CM | POA: Diagnosis not present

## 2022-07-21 ENCOUNTER — Other Ambulatory Visit: Payer: Self-pay | Admitting: Ophthalmology

## 2022-07-21 DIAGNOSIS — H4911 Fourth [trochlear] nerve palsy, right eye: Secondary | ICD-10-CM

## 2022-07-21 DIAGNOSIS — H2513 Age-related nuclear cataract, bilateral: Secondary | ICD-10-CM | POA: Diagnosis not present

## 2022-07-28 ENCOUNTER — Ambulatory Visit
Admission: RE | Admit: 2022-07-28 | Discharge: 2022-07-28 | Disposition: A | Discharge status: Home / Self Care | Payer: Medicare Other | Source: Ambulatory Visit | Attending: Ophthalmology | Admitting: Ophthalmology

## 2022-07-28 DIAGNOSIS — H532 Diplopia: Secondary | ICD-10-CM | POA: Diagnosis not present

## 2022-07-28 DIAGNOSIS — H4911 Fourth [trochlear] nerve palsy, right eye: Secondary | ICD-10-CM | POA: Insufficient documentation

## 2022-07-28 MED ORDER — GADOBUTROL 1 MMOL/ML IV SOLN
10.0000 mL | Freq: Once | INTRAVENOUS | Status: AC | PRN
  Administered 2022-07-28: 10 mL via INTRAVENOUS

## 2023-02-23 ENCOUNTER — Ambulatory Visit: Payer: Medicare Other | Admitting: Dermatology

## 2023-02-23 VITALS — BP 126/80

## 2023-02-23 DIAGNOSIS — L918 Other hypertrophic disorders of the skin: Secondary | ICD-10-CM

## 2023-02-23 DIAGNOSIS — L570 Actinic keratosis: Secondary | ICD-10-CM | POA: Diagnosis not present

## 2023-02-23 DIAGNOSIS — L820 Inflamed seborrheic keratosis: Secondary | ICD-10-CM

## 2023-02-23 DIAGNOSIS — L821 Other seborrheic keratosis: Secondary | ICD-10-CM

## 2023-02-23 DIAGNOSIS — L814 Other melanin hyperpigmentation: Secondary | ICD-10-CM | POA: Diagnosis not present

## 2023-02-23 DIAGNOSIS — L578 Other skin changes due to chronic exposure to nonionizing radiation: Secondary | ICD-10-CM | POA: Diagnosis not present

## 2023-02-23 NOTE — Progress Notes (Signed)
New Patient Visit   Subjective  Miguel Thompson is a 67 y.o. male who presents for the following: check spots face, neck, abdomen, has had for a while, sometimes itchy The patient has spots, moles and lesions to be evaluated, some may be new or changing and the patient has concerns that these could be cancer.  New patient referral from Orson Eva, NP.  The following portions of the chart were reviewed this encounter and updated as appropriate: medications, allergies, medical history  Review of Systems:  No other skin or systemic complaints except as noted in HPI or Assessment and Plan.  Objective  Well appearing patient in no apparent distress; mood and affect are within normal limits. A focused examination was performed of the following areas: Upper body Relevant exam findings are noted in the Assessment and Plan.   Assessment & Plan   INFLAMED SEBORRHEIC KERATOSIS Exam: Erythematous keratotic or waxy stuck-on papule or plaque.  Symptomatic, irritating, patient would like treated.  Benign-appearing.  Call clinic for new or changing lesions.   Prior to procedure, discussed risks of blister formation, small wound, skin dyspigmentation, or rare scar following treatment. Recommend Vaseline ointment to treated areas while healing.  Destruction Procedure Note Destruction method: cryotherapy   Informed consent: discussed and consent obtained   Lesion destroyed using liquid nitrogen: Yes   Outcome: patient tolerated procedure well with no complications   Post-procedure details: wound care instructions given   Locations: neck, chest, face. R arm # of Lesions Treated: 17  INFLAMED SEBORRHEIC KERATOSIS Exam: Erythematous keratotic or waxy stuck-on papule or plaque.  Symptomatic, irritating, patient would like treated.  Benign-appearing.  Call clinic for new or changing lesions.   Prior to procedure, discussed risks of blister formation, small wound, skin dyspigmentation,  or rare scar following treatment. Recommend Vaseline ointment to treated areas while healing.  Destruction Procedure Note Destruction method: cryotherapy   Informed consent: discussed and consent obtained   Lesion destroyed using liquid nitrogen: Yes   Outcome: patient tolerated procedure well with no complications   Post-procedure details: wound care instructions given   Locations: R lower eyelid margin # of Lesions Treated: 1  SEBORRHEIC KERATOSIS - Stuck-on, waxy, tan-brown papules and/or plaques  - Benign-appearing - Discussed benign etiology and prognosis. - Observe - Call for any changes  Acrochordons (Skin Tags) - Fleshy, skin-colored pedunculated papules - Benign appearing.  - Observe. - If desired, they can be removed with an in office procedure that is not covered by insurance. - Please call the clinic if you notice any new or changing lesions.  - neck  LENTIGINES Exam: scattered tan macules Due to sun exposure Treatment Plan: Benign-appearing, observe. Recommend daily broad spectrum sunscreen SPF 30+ to sun-exposed areas, reapply every 2 hours as needed.  Call for any changes  ACTINIC DAMAGE - chronic, secondary to cumulative UV radiation exposure/sun exposure over time - diffuse scaly erythematous macules with underlying dyspigmentation - Recommend daily broad spectrum sunscreen SPF 30+ to sun-exposed areas, reapply every 2 hours as needed.  - Recommend staying in the shade or wearing long sleeves, sun glasses (UVA+UVB protection) and wide brim hats (4-inch brim around the entire circumference of the hat). - Call for new or changing lesions.   ACTINIC KERATOSIS Exam: Erythematous thin papules/macules with gritty scale  Actinic keratoses are precancerous spots that appear secondary to cumulative UV radiation exposure/sun exposure over time. They are chronic with expected duration over 1 year. A portion of actinic keratoses will progress  to squamous cell carcinoma  of the skin. It is not possible to reliably predict which spots will progress to skin cancer and so treatment is recommended to prevent development of skin cancer.  Recommend daily broad spectrum sunscreen SPF 30+ to sun-exposed areas, reapply every 2 hours as needed.  Recommend staying in the shade or wearing long sleeves, sun glasses (UVA+UVB protection) and wide brim hats (4-inch brim around the entire circumference of the hat). Call for new or changing lesions.  Treatment Plan:  Prior to procedure, discussed risks of blister formation, small wound, skin dyspigmentation, or rare scar following cryotherapy. Recommend Vaseline ointment to treated areas while healing.  Destruction Procedure Note Destruction method: cryotherapy   Informed consent: discussed and consent obtained   Lesion destroyed using liquid nitrogen: Yes   Outcome: patient tolerated procedure well with no complications   Post-procedure details: wound care instructions given   Locations: L ear # of Lesions Treated: 1   Return for 20m, Skin Tag removal, ISK f/u, AK f/u.  I, [name], RMA, am acting as scribe for [name], MD .  Documentation: I have reviewed the above documentation for accuracy and completeness, and I agree with the above.  [name], MD

## 2023-02-23 NOTE — Patient Instructions (Addendum)

## 2023-03-12 ENCOUNTER — Encounter: Payer: Self-pay | Admitting: Dermatology

## 2023-04-12 ENCOUNTER — Other Ambulatory Visit: Payer: Self-pay | Admitting: Nurse Practitioner

## 2023-06-15 ENCOUNTER — Ambulatory Visit: Payer: Medicare Other | Admitting: Dermatology

## 2023-07-31 ENCOUNTER — Encounter: Payer: Self-pay | Admitting: Dermatology

## 2023-09-19 ENCOUNTER — Encounter: Payer: Self-pay | Admitting: Dermatology

## 2023-09-19 ENCOUNTER — Ambulatory Visit: Payer: Medicare Other | Admitting: Dermatology

## 2023-09-19 DIAGNOSIS — W908XXA Exposure to other nonionizing radiation, initial encounter: Secondary | ICD-10-CM

## 2023-09-19 DIAGNOSIS — L570 Actinic keratosis: Secondary | ICD-10-CM | POA: Diagnosis not present

## 2023-09-19 DIAGNOSIS — L821 Other seborrheic keratosis: Secondary | ICD-10-CM | POA: Diagnosis not present

## 2023-09-19 DIAGNOSIS — L820 Inflamed seborrheic keratosis: Secondary | ICD-10-CM | POA: Diagnosis not present

## 2023-09-19 DIAGNOSIS — L578 Other skin changes due to chronic exposure to nonionizing radiation: Secondary | ICD-10-CM | POA: Diagnosis not present

## 2023-09-19 NOTE — Patient Instructions (Addendum)
Cryotherapy Aftercare  Wash gently with soap and water everyday.   Apply Vaseline and Band-Aid daily until healed.   Recommend daily broad spectrum sunscreen SPF 30+ to sun-exposed areas, reapply every 2 hours as needed. Call for new or changing lesions.  Staying in the shade or wearing long sleeves, sun glasses (UVA+UVB protection) and wide brim hats (4-inch brim around the entire circumference of the hat) are also recommended for sun protection.    Due to recent changes in healthcare laws, you may see results of your pathology and/or laboratory studies on MyChart before the doctors have had a chance to review them. We understand that in some cases there may be results that are confusing or concerning to you. Please understand that not all results are received at the same time and often the doctors may need to interpret multiple results in order to provide you with the best plan of care or course of treatment. Therefore, we ask that you please give [place] 2 business days to thoroughly review all your results before contacting the office for clarification. Should we see a critical lab result, you will be contacted sooner.   If You Need Anything After Your Visit  If you have any questions or concerns for your doctor, please call our main line at [phone] and press option 4 to reach your doctor's medical assistant. If no one answers, please leave a voicemail as directed and we will return your call as soon as possible. Messages left after 4 pm will be answered the following business day.   You may also send [place] a message via MyChart. We typically respond to MyChart messages within 1-2 business days.  For prescription refills, please ask your pharmacy to contact our office. Our fax number is [phone].  If you have an urgent issue when the clinic is closed that cannot wait until the next business day, you can page your doctor at the number below.    Please note that while we do our best to be  available for urgent issues outside of office hours, we are not available 24/7.   If you have an urgent issue and are unable to reach [place], you may choose to seek medical care at your doctor's office, retail clinic, urgent care center, or emergency room.  If you have a medical emergency, please immediately call 911 or go to the emergency department.  Pager Numbers  - Dr. Gwen Pounds: [phone]  - Dr. Roseanne Reno: [phone]  - Dr. Katrinka Blazing: [phone]   In the event of inclement weather, please call our main line at [phone] for an update on the status of any delays or closures.  Dermatology Medication Tips: Please keep the boxes that topical medications come in in order to help keep track of the instructions about where and how to use these. Pharmacies typically print the medication instructions only on the boxes and not directly on the medication tubes.   If your medication is too expensive, please contact our office at [phone] option 4 or send [place] a message through MyChart.   We are unable to tell what your co-pay for medications will be in advance as this is different depending on your insurance coverage. However, we may be able to find a substitute medication at lower cost or fill out paperwork to get insurance to cover a needed medication.   If a prior authorization is required to get your medication covered by your insurance company, please allow [place] 1-2 business days to complete this process.  Drug prices often vary depending on where the prescription is filled and some pharmacies may offer cheaper prices.  The website www.goodrx.com contains coupons for medications through different pharmacies. The prices here do not account for what the cost may be with help from insurance (it may be cheaper with your insurance), but the website can give you the price if you did not use any insurance.  - You can print the associated coupon and take it with your prescription to the pharmacy.   - You may also stop by our office during regular business hours and pick up a GoodRx coupon card.  - If you need your prescription sent electronically to a different pharmacy, notify our office through [facility] or by phone at [phone] option 4.

## 2023-09-19 NOTE — Progress Notes (Signed)
Follow-Up Visit   Subjective  Miguel Thompson is a 67 y.o. male who presents for the following: AK, ISK follow up. ISKs treated 6 months ago with LN2 at neck, chest, face, right arm and right lower eyelid margin, AK treated at left ear. Some residual at left face and chest, a new spot at right forearm and left lower leg that itches, a few spots at neck.  The patient has spots, moles and lesions to be evaluated, some may be new or changing and the patient may have concern these could be cancer.   The following portions of the chart were reviewed this encounter and updated as appropriate: medications, allergies, medical history  Review of Systems:  No other skin or systemic complaints except as noted in HPI or Assessment and Plan.  Objective  Well appearing patient in no apparent distress; mood and affect are within normal limits.   A focused examination was performed of the following areas: Neck, trunk face and ears  Relevant exam findings are noted in the Assessment and Plan.  L Ear x 1 Erythematous thin papules/macules with gritty scale.   L cheek x 1, L lat inf pectoral x 1, R lat inf pectoral x 1, R distal lat forearm x 1, L med leg above ankle x 1, R neck x 10, L neck x 5 (20) Erythematous stuck-on, waxy papule or plaque, 0.6 cm at left cheek, 1.1 cm at left lateral inf pectoral, 0.8 x 0.4 cm at right lateral inf pectoral, 1.2 x 0.8 cm at L med leg above ankle, pedunculated at neck    Assessment & Plan     AK (actinic keratosis) L Ear x 1  Actinic keratoses are precancerous spots that appear secondary to cumulative UV radiation exposure/sun exposure over time. They are chronic with expected duration over 1 year. A portion of actinic keratoses will progress to squamous cell carcinoma of the skin. It is not possible to reliably predict which spots will progress to skin cancer and so treatment is recommended to prevent development of skin cancer.  Recommend daily broad  spectrum sunscreen SPF 30+ to sun-exposed areas, reapply every 2 hours as needed.  Recommend staying in the shade or wearing long sleeves, sun glasses (UVA+UVB protection) and wide brim hats (4-inch brim around the entire circumference of the hat). Call for new or changing lesions.  Consider biopsy at left ear if indicated.   Destruction of lesion - L Ear x 1  Destruction method: cryotherapy   Informed consent: discussed and consent obtained   Lesion destroyed using liquid nitrogen: Yes   Cryotherapy cycles:  2 Outcome: patient tolerated procedure well with no complications   Post-procedure details: wound care instructions given    Inflamed seborrheic keratosis (20) L cheek x 1, L lat inf pectoral x 1, R lat inf pectoral x 1, R distal lat forearm x 1, L med leg above ankle x 1, R neck x 10, L neck x 5  Symptomatic, irritating, patient would like treated.  Benign-appearing.  Call clinic for new or changing lesions.    Destruction of lesion - L cheek x 1, L lat inf pectoral x 1, R lat inf pectoral x 1, R distal lat forearm x 1, L med leg above ankle x 1, R neck x 10, L neck x 5 (20)  Destruction method: cryotherapy   Informed consent: discussed and consent obtained   Lesion destroyed using liquid nitrogen: Yes   Cryotherapy cycles:  2 Outcome: patient  tolerated procedure well with no complications   Post-procedure details: wound care instructions given    ACTINIC DAMAGE - chronic, secondary to cumulative UV radiation exposure/sun exposure over time - diffuse scaly erythematous macules with underlying dyspigmentation - Recommend daily broad spectrum sunscreen SPF 30+ to sun-exposed areas, reapply every 2 hours as needed.  - Recommend staying in the shade or wearing long sleeves, sun glasses (UVA+UVB protection) and wide brim hats (4-inch brim around the entire circumference of the hat). - Call for new or changing lesions.  SEBORRHEIC KERATOSIS - Stuck-on, waxy, tan-brown papules  and/or plaques  - Benign-appearing - Discussed benign etiology and prognosis. - Observe - Call for any changes      Return in about 6 months (around 03/18/2024) for AK follow up, ISK follow up, with Dr. Beverlyn Roux, RMA, am acting as scribe for [name], MD .   Documentation: I have reviewed the above documentation for accuracy and completeness, and I agree with the above.  [name], MD

## 2023-12-18 ENCOUNTER — Other Ambulatory Visit: Payer: Self-pay

## 2023-12-18 MED ORDER — LOSARTAN POTASSIUM 50 MG PO TABS: 50.0000 mg | ORAL_TABLET | Freq: Every morning | ORAL | 0 refills | Status: AC

## 2024-03-21 ENCOUNTER — Other Ambulatory Visit: Payer: Self-pay

## 2024-04-17 ENCOUNTER — Ambulatory Visit: Payer: Medicare Other | Admitting: Dermatology

## 2024-04-17 ENCOUNTER — Encounter: Payer: Self-pay | Admitting: Dermatology

## 2024-04-17 DIAGNOSIS — W908XXA Exposure to other nonionizing radiation, initial encounter: Secondary | ICD-10-CM | POA: Diagnosis not present

## 2024-04-17 DIAGNOSIS — L821 Other seborrheic keratosis: Secondary | ICD-10-CM | POA: Diagnosis not present

## 2024-04-17 DIAGNOSIS — L570 Actinic keratosis: Secondary | ICD-10-CM | POA: Diagnosis not present

## 2024-04-17 DIAGNOSIS — D692 Other nonthrombocytopenic purpura: Secondary | ICD-10-CM

## 2024-04-17 DIAGNOSIS — L578 Other skin changes due to chronic exposure to nonionizing radiation: Secondary | ICD-10-CM | POA: Diagnosis not present

## 2024-04-17 DIAGNOSIS — L820 Inflamed seborrheic keratosis: Secondary | ICD-10-CM | POA: Diagnosis not present

## 2024-04-17 NOTE — Patient Instructions (Addendum)
 Please call or send mychart if spot at left ear is not resolved in 8 weeks. Will bring you back in for treatment       Actinic keratoses are precancerous spots that appear secondary to cumulative UV radiation exposure/sun exposure over time. They are chronic with expected duration over 1 year. A portion of actinic keratoses will progress to squamous cell carcinoma of the skin. It is not possible to reliably predict which spots will progress to skin cancer and so treatment is recommended to prevent development of skin cancer.  Recommend daily broad spectrum sunscreen SPF 30+ to sun-exposed areas, reapply every 2 hours as needed.  Recommend staying in the shade or wearing long sleeves, sun glasses (UVA+UVB protection) and wide brim hats (4-inch brim around the entire circumference of the hat). Call for new or changing lesions.   Cryotherapy Aftercare  Wash gently with soap and water everyday.   Apply Vaseline and Band-Aid daily until healed.     Seborrheic Keratosis  What causes seborrheic keratoses? Seborrheic keratoses are harmless, common skin growths that first appear during adult life.  As time goes by, more growths appear.  Some people may develop a large number of them.  Seborrheic keratoses appear on both covered and uncovered body parts.  They are not caused by sunlight.  The tendency to develop seborrheic keratoses can be inherited.  They vary in color from skin-colored to gray, brown, or even black.  They can be either smooth or have a rough, warty surface.   Seborrheic keratoses are superficial and look as if they were stuck on the skin.  Under the microscope this type of keratosis looks like layers upon layers of skin.  That is why at times the top layer may seem to fall off, but the rest of the growth remains and re-grows.    Treatment Seborrheic keratoses do not need to be treated, but can easily be removed in the office.  Seborrheic keratoses often cause symptoms when they  rub on clothing or jewelry.  Lesions can be in the way of shaving.  If they become inflamed, they can cause itching, soreness, or burning.  Removal of a seborrheic keratosis can be accomplished by freezing, burning, or surgery. If any spot bleeds, scabs, or grows rapidly, please return to have it checked, as these can be an indication of a skin cancer.      Due to recent changes in healthcare laws, you may see results of your pathology and/or laboratory studies on MyChart before the doctors have had a chance to review them. We understand that in some cases there may be results that are confusing or concerning to you. Please understand that not all results are received at the same time and often the doctors may need to interpret multiple results in order to provide you with the best plan of care or course of treatment. Therefore, we ask that you please give us  2 business days to thoroughly review all your results before contacting the office for clarification. Should we see a critical lab result, you will be contacted sooner.   If You Need Anything After Your Visit  If you have any questions or concerns for your doctor, please call our main line at [phone] and press option 4 to reach your doctor's medical assistant. If no one answers, please leave a voicemail as directed and we will return your call as soon as possible. Messages left after 4 pm will be answered the following business day.  You may also send us  a message via MyChart. We typically respond to MyChart messages within 1-2 business days.  For prescription refills, please ask your pharmacy to contact our office. Our fax number is [phone].  If you have an urgent issue when the clinic is closed that cannot wait until the next business day, you can page your doctor at the number below.    Please note that while we do our best to be available for urgent issues outside of office hours, we are not available 24/7.   If you have an  urgent issue and are unable to reach us , you may choose to seek medical care at your doctor's office, retail clinic, urgent care center, or emergency room.  If you have a medical emergency, please immediately call 911 or go to the emergency department.  Pager Numbers  - Dr. Bary Likes: [phone]  - Dr. Annette Barters: [phone]  - Dr. Felipe Horton: [phone]   In the event of inclement weather, please call our main line at [phone] for an update on the status of any delays or closures.  Dermatology Medication Tips: Please keep the boxes that topical medications come in in order to help keep track of the instructions about where and how to use these. Pharmacies typically print the medication instructions only on the boxes and not directly on the medication tubes.   If your medication is too expensive, please contact our office at [phone] option 4 or send us  a message through MyChart.   We are unable to tell what your co-pay for medications will be in advance as this is different depending on your insurance coverage. However, we may be able to find a substitute medication at lower cost or fill out paperwork to get insurance to cover a needed medication.   If a prior authorization is required to get your medication covered by your insurance company, please allow us  1-2 business days to complete this process.  Drug prices often vary depending on where the prescription is filled and some pharmacies may offer cheaper prices.  The website www.goodrx.com contains coupons for medications through different pharmacies. The prices here do not account for what the cost may be with help from insurance (it may be cheaper with your insurance), but the website can give you the price if you did not use any insurance.  - You can print the associated coupon and take it with your prescription to the pharmacy.  - You may also stop by our office during regular business hours and pick up a GoodRx coupon card.   - If you need your prescription sent electronically to a different pharmacy, notify our office through [place] or by phone at [phone] option 4.     Si Usted Necesita Algo Despus de Su Visita  Tambin puede enviarnos un mensaje a travs de Clinical cytogeneticist. Por lo general respondemos a los mensajes de MyChart en el transcurso de 1 a 2 das hbiles.  Para renovar recetas, por favor pida a su farmacia que se ponga en contacto con nuestra oficina. Franz Jacks de fax es [place] [phone].  Si tiene un asunto urgente cuando la clnica est cerrada y que no puede esperar hasta el siguiente da hbil, puede llamar/localizar a su doctor(a) al nmero que aparece a continuacin.   Por favor, tenga en cuenta que aunque hacemos todo lo posible para estar disponibles para asuntos urgentes fuera del horario de [place], no estamos disponibles las 24 horas del da, los 7 [address]  [address] de la [place].  Si tiene un problema urgente y no puede comunicarse con nosotros, puede optar por buscar atencin mdica  en el consultorio de su doctor(a), en una clnica privada, en un centro de atencin urgente o en una sala de emergencias.  Si tiene Engineer, drilling, por favor llame inmediatamente al 911 o vaya a la sala de emergencias.  Nmeros de bper  - Dr. Bary Likes: [phone]  - Dra. Annette Barters: [phone]  - Dr. Felipe Horton: [phone]   En caso de inclemencias del tiempo, por favor llame a Lajuan Pila principal al [phone] para una actualizacin sobre el [place] de cualquier retraso o cierre.  Consejos para la medicacin en dermatologa: Por favor, guarde las cajas en las que vienen los medicamentos de uso tpico para ayudarle a seguir las instrucciones sobre dnde y cmo usarlos. Las farmacias generalmente imprimen las instrucciones del medicamento slo en las cajas y no directamente en los tubos del [place].   Si su medicamento es muy caro, por favor, pngase en contacto con Bettyjane Brunet  llamando al [phone] y presione la opcin 4 o envenos un mensaje a travs de Clinical cytogeneticist.   No podemos decirle cul ser su copago por los medicamentos por adelantado ya que esto es diferente dependiendo de la cobertura de su seguro. Sin embargo, es posible que podamos encontrar un medicamento sustituto a Audiological scientist un formulario para que el seguro cubra el medicamento que se considera necesario.   Si se requiere una autorizacin previa para que su compaa de seguros [place] su medicamento, por favor permtanos de 1 a 2 das hbiles para completar este proceso.  Los precios de los medicamentos varan con frecuencia dependiendo del Environmental consultant de dnde se surte la receta y alguna farmacias pueden ofrecer precios ms baratos.  El sitio web www.goodrx.com tiene cupones para medicamentos de Health and safety inspector. Los precios aqu no tienen en cuenta lo que podra costar con la ayuda del seguro (puede ser ms barato con su seguro), pero el sitio web puede darle el precio si no utiliz Tourist information centre manager.  - Puede imprimir el cupn correspondiente y llevarlo con su receta a la farmacia.  - Tambin puede pasar por nuestra oficina durante el horario de atencin regular y Education officer, museum una tarjeta de cupones de GoodRx.  - Si necesita que su receta se enve electrnicamente a una farmacia diferente, informe a nuestra oficina a travs de MyChart de [place] o por telfono llamando al [phone] y presione la opcin 4.

## 2024-04-17 NOTE — Progress Notes (Signed)
 Follow-Up Visit   Subjective  Miguel Thompson is a 68 y.o. male who presents for the following: ak and isk follow up  Spots left ear and left forehead And spot at right preauricular   The patient has spots, moles and lesions to be evaluated, some may be new or changing and the patient may have concern these could be cancer.  The following portions of the chart were reviewed this encounter and updated as appropriate: medications, allergies, medical history  Review of Systems:  No other skin or systemic complaints except as noted in HPI or Assessment and Plan.  Objective  Well appearing patient in no apparent distress; mood and affect are within normal limits.  .A focused examination was performed of the following areas: Face, left ear   Relevant exam findings are noted in the Assessment and Plan.  left forehead x 1, left superior helix x 1 (2) Erythematous thin papules/macules with gritty scale.  right sideburn area x 1 Erythematous stuck-on, waxy papule or plaque  Assessment & Plan   Purpura - Chronic; persistent and recurrent.  Treatable, but not curable. - Violaceous macules and patches - Benign - Related to trauma, age, sun damage and/or use of blood thinners, chronic use of topical and/or oral steroids - Observe - Can use OTC arnica containing moisturizer such as Dermend Bruise Formula if desired - Call for worsening or other concerns  SEBORRHEIC KERATOSIS - Stuck-on, waxy, tan-brown papules and/or plaques  - Benign-appearing - Discussed benign etiology and prognosis. - Observe - Call for any changes  ACTINIC DAMAGE - chronic, secondary to cumulative UV radiation exposure/sun exposure over time - diffuse scaly erythematous macules with underlying dyspigmentation - Recommend daily broad spectrum sunscreen SPF 30+ to sun-exposed areas, reapply every 2 hours as needed.  - Recommend staying in the shade or wearing long sleeves, sun glasses (UVA+UVB protection) and  wide brim hats (4-inch brim around the entire circumference of the hat). - Call for new or changing lesions.  ACTINIC KERATOSIS (2) left forehead x 1, left superior helix x 1 (2) Left ear superior helix x 1 - if spot is still there 8 weeks call or send mychart may consider bx  Actinic keratoses are precancerous spots that appear secondary to cumulative UV radiation exposure/sun exposure over time. They are chronic with expected duration over 1 year. A portion of actinic keratoses will progress to squamous cell carcinoma of the skin. It is not possible to reliably predict which spots will progress to skin cancer and so treatment is recommended to prevent development of skin cancer.  Recommend daily broad spectrum sunscreen SPF 30+ to sun-exposed areas, reapply every 2 hours as needed.  Recommend staying in the shade or wearing long sleeves, sun glasses (UVA+UVB protection) and wide brim hats (4-inch brim around the entire circumference of the hat). Call for new or changing lesions. Destruction of lesion - left forehead x 1, left superior helix x 1 (2) Complexity: simple   Destruction method: cryotherapy   Informed consent: discussed and consent obtained   Timeout:  patient name, date of birth, surgical site, and procedure verified Lesion destroyed using liquid nitrogen: Yes   Region frozen until ice ball extended beyond lesion: Yes   Outcome: patient tolerated procedure well with no complications   Post-procedure details: wound care instructions given   INFLAMED SEBORRHEIC KERATOSIS right sideburn area x 1 Symptomatic, irritating, patient would like treated. Destruction of lesion - right sideburn area x 1 Complexity: simple   Destruction  method: cryotherapy   Informed consent: discussed and consent obtained   Timeout:  patient name, date of birth, surgical site, and procedure verified Lesion destroyed using liquid nitrogen: Yes   Region frozen until ice ball extended beyond lesion: Yes    Outcome: patient tolerated procedure well with no complications   Post-procedure details: wound care instructions given    Return for 1 year tbse .  IRandee Busing, CMA, am acting as scribe for Celine Collard, MD.   Documentation: I have reviewed the above documentation for accuracy and completeness, and I agree with the above.  Celine Collard, MD

## 2024-04-25 ENCOUNTER — Other Ambulatory Visit: Payer: Self-pay

## 2024-05-09 ENCOUNTER — Other Ambulatory Visit: Payer: Self-pay

## 2024-05-27 ENCOUNTER — Other Ambulatory Visit: Payer: Self-pay

## 2024-05-31 ENCOUNTER — Telehealth: Payer: Self-pay

## 2024-05-31 NOTE — Progress Notes (Signed)
   05/31/2024  Patient ID: Ryan JONETTA Olden, male   DOB: 05/05/1956, 68 y.o.   MRN: [id number]  Pharmacy Quality Measure Review  This patient is appearing on a report for being at risk of failing the adherence measure for cholesterol (statin) and hypertension (ACEi/ARB) medications this calendar year.   Medication: Losartan  Last fill date: 12/18/23 for 90 day supply  Medication: Rosuvastatin Last fill date: 01/19/24 for 90 day supply  Left voicemail for patient to return my call at their convenience.  Jon VEAR Lindau, PharmD Clinical Pharmacist [phone]

## 2024-06-03 ENCOUNTER — Other Ambulatory Visit: Payer: Self-pay

## 2024-06-03 MED ORDER — ROSUVASTATIN CALCIUM 40 MG PO TABS: 40.0000 mg | ORAL_TABLET | Freq: Every day | ORAL | 0 refills | Status: AC

## 2024-07-16 ENCOUNTER — Other Ambulatory Visit: Payer: Self-pay

## 2024-12-17 ENCOUNTER — Ambulatory Visit: Admitting: Dermatology

## 2025-04-23 ENCOUNTER — Ambulatory Visit: Admitting: Dermatology
